# Patient Record
Sex: Female | Born: 1975 | Race: Black or African American | Hispanic: No | Marital: Single | Smoking: Current every day smoker
Health system: Southern US, Community
[De-identification: ages and names within clinical notes are randomized; demographics above are authoritative.]

## PROBLEM LIST (undated history)

## (undated) DIAGNOSIS — I10 Essential (primary) hypertension: Secondary | ICD-10-CM

## (undated) DIAGNOSIS — E079 Disorder of thyroid, unspecified: Secondary | ICD-10-CM

---

## 2016-12-24 ENCOUNTER — Emergency Department (HOSPITAL_COMMUNITY): Payer: Self-pay

## 2016-12-24 ENCOUNTER — Encounter (HOSPITAL_COMMUNITY): Payer: Self-pay | Admitting: Emergency Medicine

## 2016-12-24 ENCOUNTER — Emergency Department (HOSPITAL_COMMUNITY)
Admission: EM | Admit: 2016-12-24 | Discharge: 2016-12-25 | Disposition: A | Discharge status: Home / Self Care | Payer: Self-pay | Attending: Emergency Medicine | Admitting: Emergency Medicine

## 2016-12-24 ENCOUNTER — Other Ambulatory Visit: Payer: Self-pay

## 2016-12-24 DIAGNOSIS — R51 Headache: Secondary | ICD-10-CM | POA: Insufficient documentation

## 2016-12-24 DIAGNOSIS — R0602 Shortness of breath: Secondary | ICD-10-CM | POA: Insufficient documentation

## 2016-12-24 DIAGNOSIS — R0789 Other chest pain: Secondary | ICD-10-CM

## 2016-12-24 DIAGNOSIS — R519 Headache, unspecified: Secondary | ICD-10-CM

## 2016-12-24 HISTORY — DX: Essential (primary) hypertension: I10

## 2016-12-24 HISTORY — DX: Disorder of thyroid, unspecified: E07.9

## 2016-12-24 LAB — I-STAT TROPONIN, ED
Troponin i, poc: 0 ng/mL (ref 0.00–0.08)
Troponin i, poc: 0 ng/mL (ref 0.00–0.08)

## 2016-12-24 LAB — BASIC METABOLIC PANEL
Anion gap: 9 (ref 5–15)
BUN: 10 mg/dL (ref 6–20)
CO2: 25 mmol/L (ref 22–32)
Calcium: 9.2 mg/dL (ref 8.9–10.3)
Chloride: 104 mmol/L (ref 101–111)
Creatinine, Ser: 0.7 mg/dL (ref 0.44–1.00)
GFR calc Af Amer: >60 mL/min (ref >60)
GFR calc non Af Amer: >60 mL/min (ref >60)
Glucose, Bld: 100 mg/dL — ABNORMAL HIGH (ref 65–99)
Potassium: 3.7 mmol/L (ref 3.5–5.1)
Sodium: 138 mmol/L (ref 135–145)

## 2016-12-24 LAB — CBC
HCT: 27.5 % — ABNORMAL LOW (ref 36.0–46.0)
Hemoglobin: 7.3 g/dL — ABNORMAL LOW (ref 12.0–15.0)
MCH: 17.5 pg — ABNORMAL LOW (ref 26.0–34.0)
MCHC: 26.5 g/dL — ABNORMAL LOW (ref 30.0–36.0)
MCV: 65.8 fL — ABNORMAL LOW (ref 78.0–100.0)
Platelets: 556 10*3/uL — ABNORMAL HIGH (ref 150–400)
RBC: 4.18 MIL/uL (ref 3.87–5.11)
RDW: 18.6 % — ABNORMAL HIGH (ref 11.5–15.5)
WBC: 6.6 10*3/uL (ref 4.0–10.5)

## 2016-12-24 LAB — I-STAT BETA HCG BLOOD, ED (MC, WL, AP ONLY): I-stat hCG, quantitative: <5 m[IU]/mL (ref <5)

## 2016-12-24 LAB — TSH: TSH: 2.842 u[IU]/mL (ref 0.350–4.500)

## 2016-12-24 LAB — D-DIMER, QUANTITATIVE: D-Dimer, Quant: <0.27 ug/mL-FEU (ref 0.00–0.50)

## 2016-12-24 LAB — BRAIN NATRIURETIC PEPTIDE: B Natriuretic Peptide: 25.4 pg/mL (ref 0.0–100.0)

## 2016-12-24 MED ORDER — PROCHLORPERAZINE MALEATE 10 MG PO TABS
10.0000 mg | ORAL_TABLET | Freq: Once | ORAL | Status: AC
  Administered 2016-12-25: 10 mg via ORAL
  Filled 2016-12-24: qty 1

## 2016-12-24 MED ORDER — SODIUM CHLORIDE 0.9 % IV BOLUS (SEPSIS)
1000.0000 mL | Freq: Once | INTRAVENOUS | Status: AC
  Administered 2016-12-24: 1000 mL via INTRAVENOUS

## 2016-12-24 MED ORDER — DEXAMETHASONE SODIUM PHOSPHATE 10 MG/ML IJ SOLN
10.0000 mg | Freq: Once | INTRAMUSCULAR | Status: AC
  Administered 2016-12-24: 10 mg via INTRAVENOUS
  Filled 2016-12-24: qty 1

## 2016-12-24 MED ORDER — KETOROLAC TROMETHAMINE 30 MG/ML IJ SOLN
30.0000 mg | Freq: Once | INTRAMUSCULAR | Status: AC
  Administered 2016-12-24: 30 mg via INTRAVENOUS
  Filled 2016-12-24: qty 1

## 2016-12-24 MED ORDER — SODIUM CHLORIDE 0.9 % IV BOLUS (SEPSIS)
500.0000 mL | Freq: Once | INTRAVENOUS | Status: AC
  Administered 2016-12-24: 500 mL via INTRAVENOUS

## 2016-12-24 MED ORDER — DIPHENHYDRAMINE HCL 50 MG/ML IJ SOLN
50.0000 mg | Freq: Once | INTRAMUSCULAR | Status: AC
  Administered 2016-12-24: 50 mg via INTRAVENOUS
  Filled 2016-12-24: qty 1

## 2016-12-24 NOTE — ED Triage Notes (Signed)
Patient complaining of multiple symptoms. She states she was on hypertention and hyperthyroid medications but had to stop taking them due to hard times. Patient states she is having mid chest pain that has been going on for a month. Patient has been real thirsty and feels like her chest is about to jump out of her chest. Patient also says she feels weak.
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
## 2016-12-25 MED ORDER — METOPROLOL SUCCINATE ER 25 MG PO TB24: 25.0000 mg | ORAL_TABLET | Freq: Every day | ORAL | 0 refills | Status: AC

## 2016-12-25 NOTE — Discharge Instructions (Signed)
Take metoprolol daily as prescribed.  You should be able to get [facility] for around $4 [facility] but I have also included a coupon for [employer].  Drink plenty of fluids and get plenty of rest.  Follow-up with a primary care physician for reevaluation of your symptoms.  I have attached information for [place] and wellness.  Return to the ED if any concerning signs or symptoms develop such as persisting vision changes, severe headache, persisting chest pain or shortness of breath, or weakness.

## 2019-03-05 ENCOUNTER — Ambulatory Visit: Payer: Self-pay | Admitting: Orthopaedic Surgery

## 2019-03-07 ENCOUNTER — Other Ambulatory Visit: Payer: Self-pay

## 2019-03-07 ENCOUNTER — Ambulatory Visit (INDEPENDENT_AMBULATORY_CARE_PROVIDER_SITE_OTHER): Payer: 59 | Admitting: Orthopaedic Surgery

## 2019-03-07 ENCOUNTER — Encounter: Payer: Self-pay | Admitting: Orthopaedic Surgery

## 2019-03-07 VITALS — Ht 66.0 in | Wt 230.0 lb

## 2019-03-07 DIAGNOSIS — M25572 Pain in left ankle and joints of left foot: Secondary | ICD-10-CM | POA: Diagnosis not present
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
## 2019-03-19 ENCOUNTER — Other Ambulatory Visit: Payer: 59

## 2019-03-25 ENCOUNTER — Ambulatory Visit
Admission: RE | Admit: 2019-03-25 | Discharge: 2019-03-25 | Disposition: A | Discharge status: Home / Self Care | Payer: 59 | Source: Ambulatory Visit | Attending: Orthopaedic Surgery | Admitting: Orthopaedic Surgery

## 2019-03-25 ENCOUNTER — Other Ambulatory Visit: Payer: Self-pay

## 2019-03-25 DIAGNOSIS — M25572 Pain in left ankle and joints of left foot: Secondary | ICD-10-CM

## 2019-03-26 ENCOUNTER — Ambulatory Visit: Payer: 59 | Admitting: Orthopaedic Surgery

## 2019-03-28 ENCOUNTER — Other Ambulatory Visit: Payer: Self-pay

## 2019-03-28 ENCOUNTER — Encounter: Payer: Self-pay | Admitting: Orthopaedic Surgery

## 2019-03-28 ENCOUNTER — Ambulatory Visit (INDEPENDENT_AMBULATORY_CARE_PROVIDER_SITE_OTHER): Payer: 59 | Admitting: Orthopaedic Surgery

## 2019-03-28 VITALS — Ht 66.0 in | Wt 230.0 lb

## 2019-03-28 DIAGNOSIS — M25572 Pain in left ankle and joints of left foot: Secondary | ICD-10-CM | POA: Diagnosis not present

## 2019-03-28 NOTE — Progress Notes (Signed)
Office Visit Note   Patient: Monica Alexander           Date of Birth: October 21, 1975           MRN: [id number] Visit Date: 03/28/2019              Requested by: No referring provider defined for this encounter. PCP: Patient, No Pcp Per   Assessment & Plan: Visit Diagnoses:  1. Pain in left ankle and joints of left foot     Plan: MRI scan of left ankle did not demonstrate any soft tissue abnormalities and or evidence of a fracture.  Her pain clinically is consistent with an ankle sprain but again ligaments were fine by MRI scan.  She has a history of "neuropathy" diagnosed by her primary care physician and does have some bilateral looks remedy edema when her foot is in a dependent position for any length of time.  She will discuss that with her primary care physician but it might be worthwhile for her to even consider support stockings.  She is more comfortable in equalizer boot but I think an ASO ankle support might be even better will apply this and just monitor  Follow-Up Instructions: Return if symptoms worsen or fail to improve.   Orders:  No orders of the defined types were placed in this encounter.  No orders of the defined types were placed in this encounter.     Procedures: No procedures performed   Clinical Data: No additional findings.   Subjective: Chief Complaint  Patient presents with  . Left Ankle - Follow-up    MRI results  Patient presents today for follow up on her left ankle. She had an MRI on 03/25/2019 and is here today for those results. Patient states that her ankle is still painful, and seems to be worse at night. She has swelling. She wears her walking boot if going outside. She is not taking anything for pain.   HPI  Review of Systems   Objective: Vital Signs: Ht 5\' 6"  (1.676 m)   Wt 230 lb (104.3 kg)   LMP 02/28/2019   BMI 37.12 kg/m   Physical Exam Constitutional:      Appearance: She is well-developed.  Eyes:     Pupils: Pupils are  equal, round, and reactive to light.  Pulmonary:     Effort: Pulmonary effort is normal.  Skin:    General: Skin is warm and dry.  Neurological:     Mental Status: She is alert and oriented to person, place, and time.  Psychiatric:        Behavior: Behavior normal.     Ortho Exam awake alert and oriented x3.  Comfortable sitting.  Left ankle was not hot red warm or swollen.  No swelling.  Neurologically intact.  +1 pulses.  No instability.  Little bit of tenderness over the anterior talofibular and the fibulocalcaneal ligaments.  No pain behind  the malleoli or about the Achilles tendon.  Specialty Comments:  No specialty comments available.  Imaging: No results found.   PMFS History: Patient Active Problem List   Diagnosis Date Noted  . Pain in left ankle and joints of left foot 03/07/2019   Past Medical History:  Diagnosis Date  . Hypertension   . Thyroid disease     History reviewed. No pertinent family history.  History reviewed. No pertinent surgical history. Social History   Occupational History  . Not on file  Tobacco Use  . Smoking status:  Current Every Day Smoker  . Smokeless tobacco: Never Used  Substance and Sexual Activity  . Alcohol use: No  . Drug use: No  . Sexual activity: Not on file

## 2020-09-27 IMAGING — MR MR ANKLE*L* W/O CM
5 series · 37 of 40 positions shown · non-contrast
Comparison: Radiographs dated 01/07/2019

CLINICAL DATA: Lateral left ankle pain secondary to an injury 2
months ago.

EXAM:
MRI OF THE LEFT ANKLE WITHOUT CONTRAST
TECHNIQUE: Multiplanar, multisequence MR imaging of the ankle was performed. No
intravenous contrast was administered.

[Series 4: T2 fat-sat · axial · 3.0mm · 0.50mm/px · z∈[-91,+29]mm · 9 of 32 slices shown (1 of 2)]
[im 1/32]
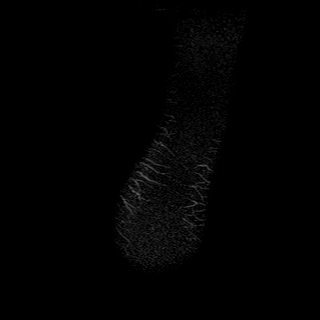
[im 4/32]
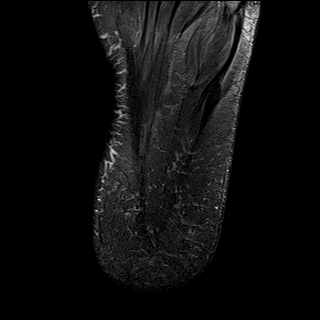
[im 8/32]
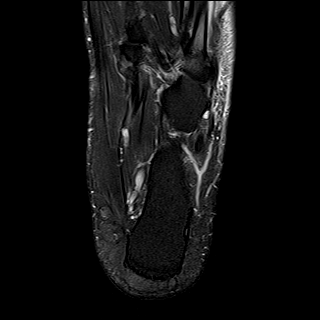
[im 12/32]
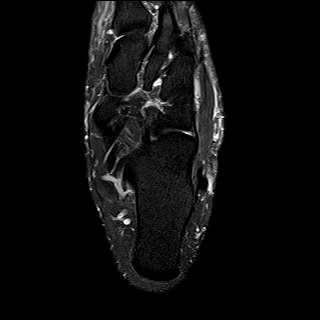
[im 16/32]
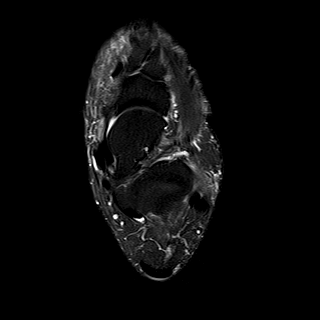
[im 20/32]
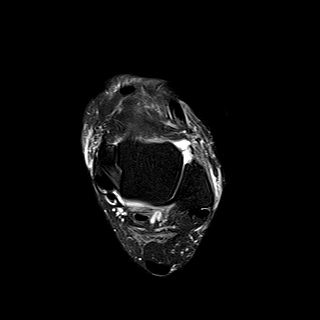
[im 24/32]
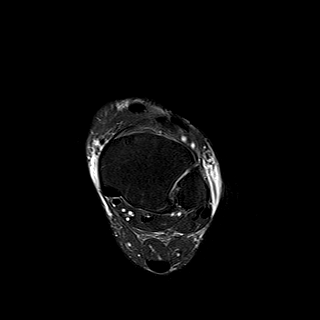
[im 28/32]
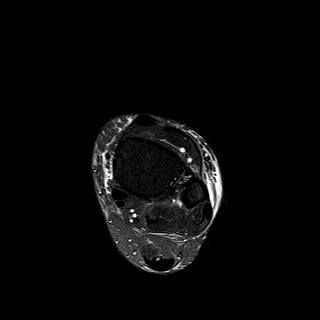
[im 32/32]
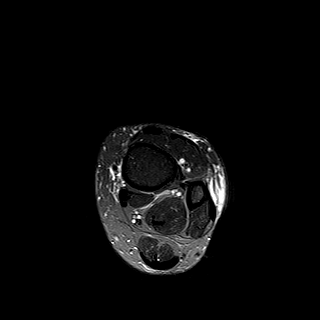

[Series 5: PD fat-sat · axial · 3.0mm · 0.42mm/px · z∈[-91,+29]mm · 9 of 32 slices shown]
[im 1/32]
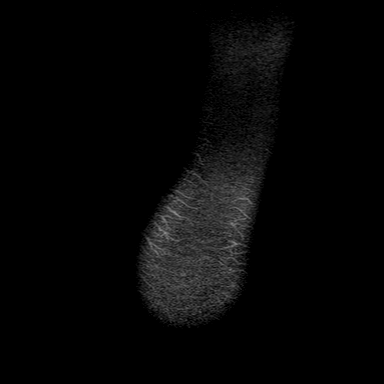
[im 4/32]
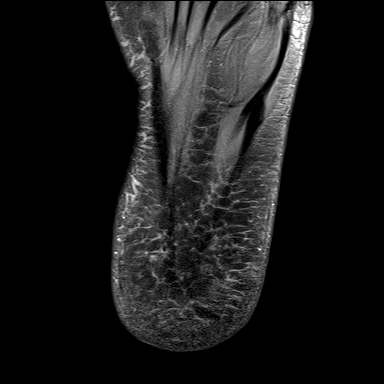
[im 8/32]
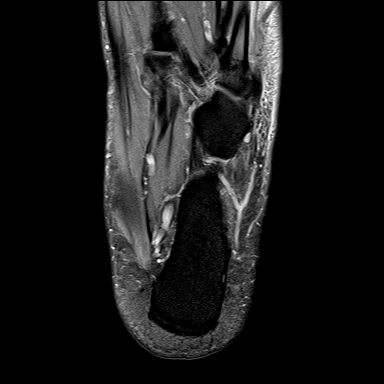
[im 12/32]
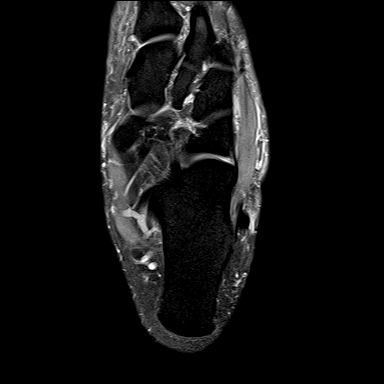
[im 16/32]
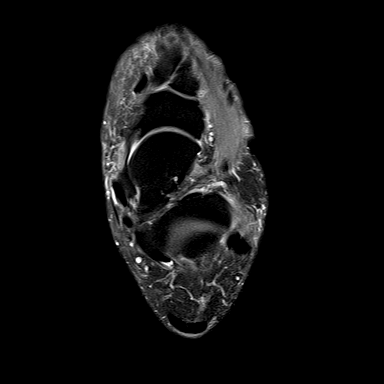
[im 20/32]
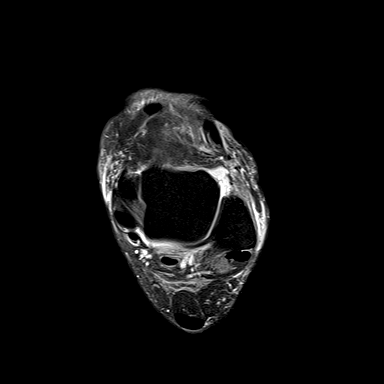
[im 24/32]
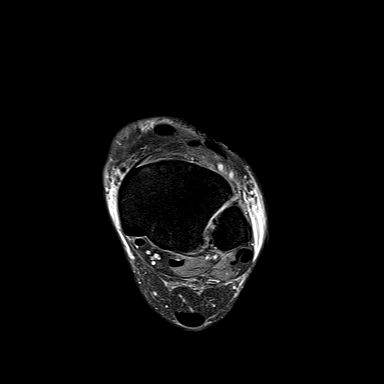
[im 28/32]
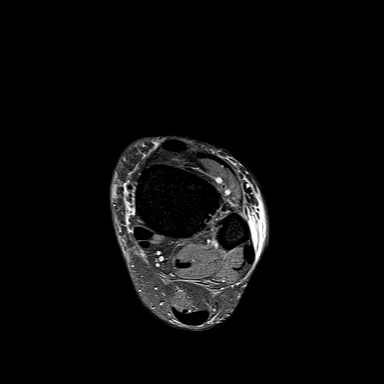
[im 32/32]
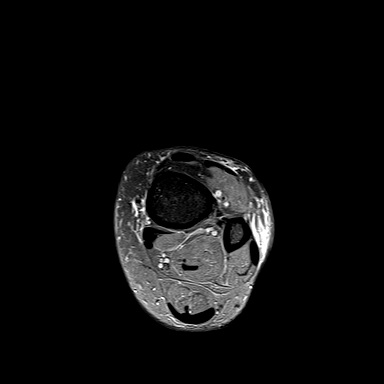

[Series 6: T1 · sagittal · 4.0mm · 0.56mm/px · 6 of 20 slices shown]
[im 1/20]
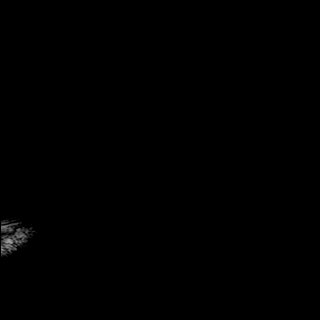
[im 4/20]
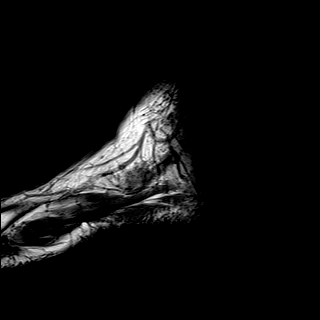
[im 8/20]
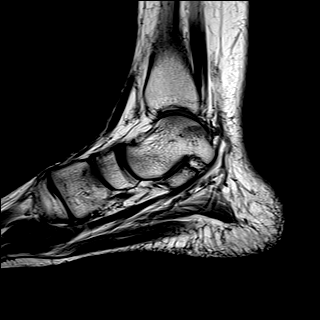
[im 12/20]
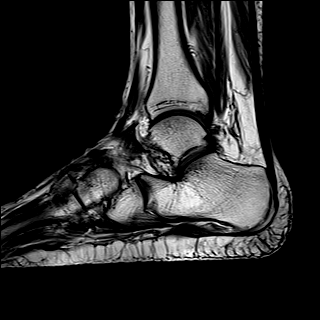
[im 16/20]
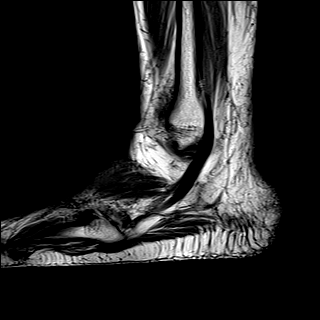
[im 20/20]
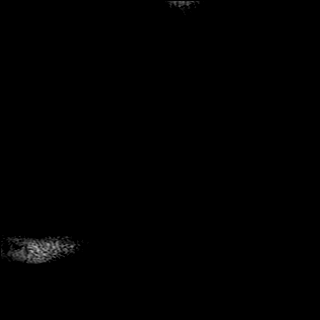

[Series 7: STIR · sagittal · 4.0mm · 0.35mm/px · 5 of 20 slices shown]
[im 1/20]
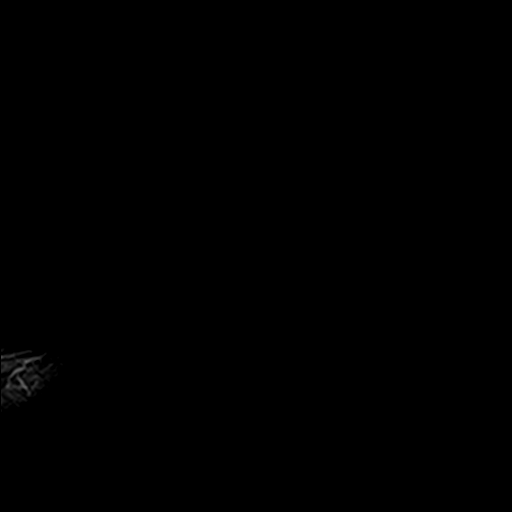
[im 4/20]
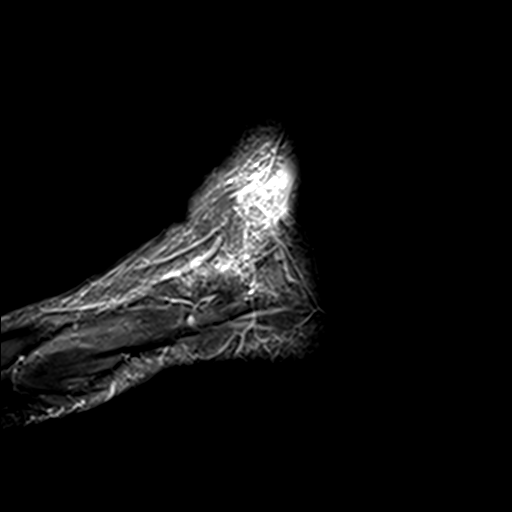
[im 8/20]
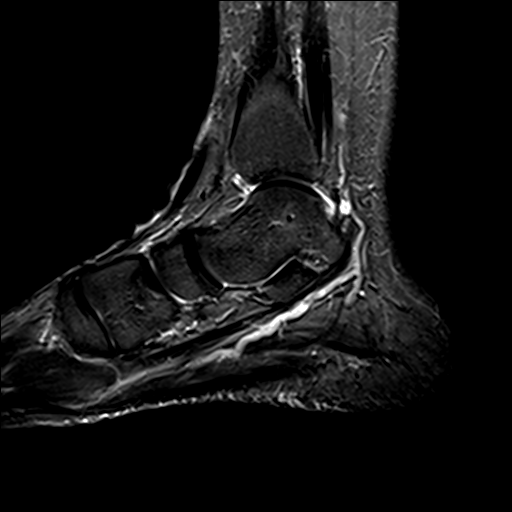
[im 12/20]
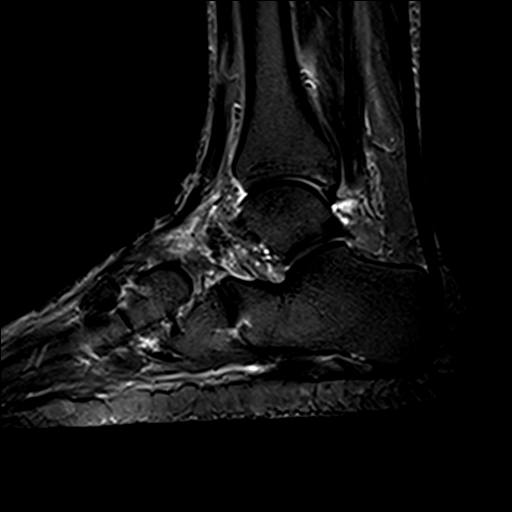
[im 16/20]
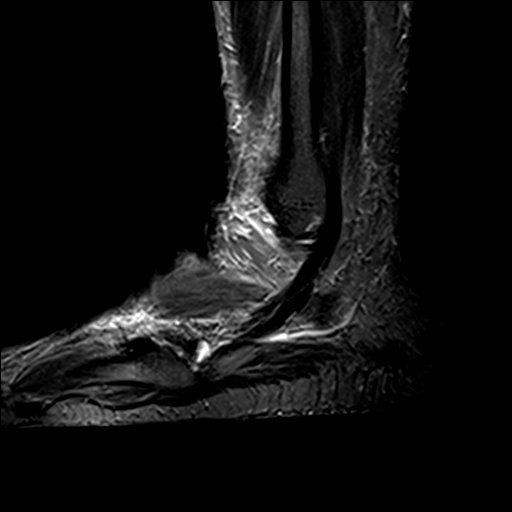

[Series 8: T2 fat-sat · coronal · 3.0mm · 0.50mm/px · 8 of 36 slices shown (2 of 2)]
[im 1/36]
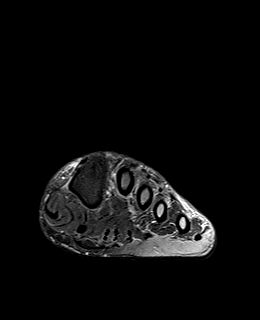
[im 4/36]
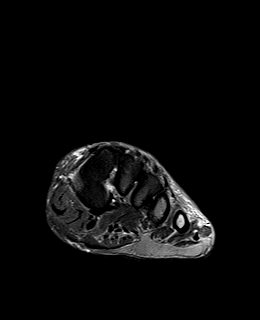
[im 12/36]
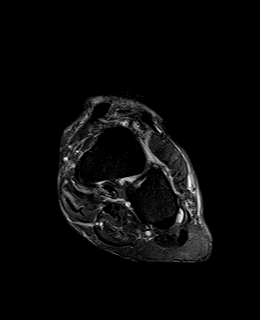
[im 16/36]
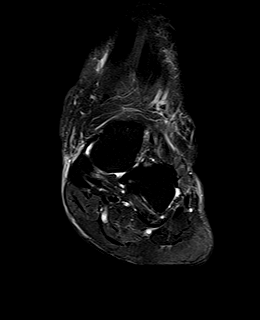
[im 20/36]
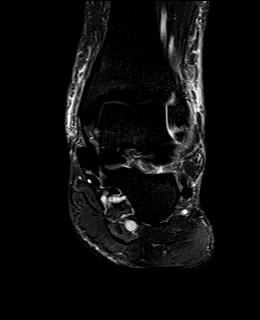
[im 24/36]
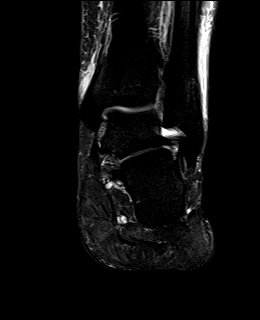
[im 32/36]
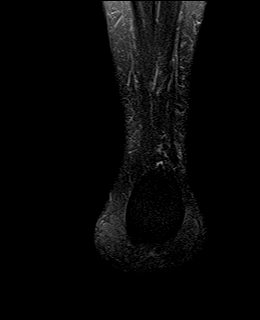
[im 36/36]
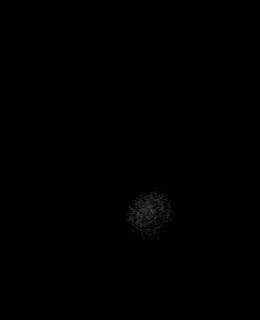

[37 of 40 positions shown; findings below may reference images not displayed]

FINDINGS: TENDONS

Peroneal: Normal.

Posteromedial: Normal.

Anterior: Normal.

Achilles: Normal.

Plantar Fascia: Normal.

LIGAMENTS

Lateral: Intact.

Medial: Intact.

CARTILAGE

Ankle Joint: Small ankle effusion. No chondral defect.

Subtalar Joints/Sinus Tarsi: Normal.

Bones: Normal.

Other: There is subcutaneous edema at the medial and lateral aspects
of the ankle, nonspecific.
IMPRESSION: 1. Nonspecific subcutaneous edema at the medial and lateral aspects
of the ankle.
2. Small ankle effusion.
3. Otherwise, normal exam.
